# Patient Record
Sex: Female | Born: 1980 | Race: White | Hispanic: No | Marital: Married | Smoking: Current every day smoker
Health system: Southern US, Community
[De-identification: ages and names within clinical notes are randomized; demographics above are authoritative.]

## PROBLEM LIST (undated history)

## (undated) ENCOUNTER — Ambulatory Visit: Admission: EM | Source: Home / Self Care

## (undated) HISTORY — PX: ANKLE SURGERY: SHX546

## (undated) HISTORY — PX: CHOLECYSTECTOMY: SHX55

---

## 2012-04-20 ENCOUNTER — Ambulatory Visit: Payer: Self-pay | Admitting: Orthopaedic Surgery

## 2012-04-20 LAB — HCG, QUANTITATIVE, PREGNANCY: Beta Hcg, Quant.: <1 m[IU]/mL — ABNORMAL LOW

## 2014-11-10 ENCOUNTER — Other Ambulatory Visit: Payer: Self-pay | Admitting: Orthopedic Surgery

## 2014-11-10 DIAGNOSIS — M25511 Pain in right shoulder: Secondary | ICD-10-CM

## 2014-11-10 DIAGNOSIS — M75101 Unspecified rotator cuff tear or rupture of right shoulder, not specified as traumatic: Secondary | ICD-10-CM

## 2014-11-18 ENCOUNTER — Ambulatory Visit
Admission: RE | Admit: 2014-11-18 | Discharge: 2014-11-18 | Disposition: A | Discharge status: Home / Self Care | Payer: No Typology Code available for payment source | Source: Ambulatory Visit | Attending: Orthopedic Surgery | Admitting: Orthopedic Surgery

## 2014-11-18 DIAGNOSIS — M25511 Pain in right shoulder: Secondary | ICD-10-CM

## 2014-11-18 DIAGNOSIS — M7551 Bursitis of right shoulder: Secondary | ICD-10-CM | POA: Diagnosis not present

## 2014-11-18 DIAGNOSIS — M75101 Unspecified rotator cuff tear or rupture of right shoulder, not specified as traumatic: Secondary | ICD-10-CM

## 2016-05-30 DIAGNOSIS — M25571 Pain in right ankle and joints of right foot: Secondary | ICD-10-CM | POA: Diagnosis not present

## 2016-05-30 DIAGNOSIS — G894 Chronic pain syndrome: Secondary | ICD-10-CM | POA: Diagnosis not present

## 2016-05-30 DIAGNOSIS — M19079 Primary osteoarthritis, unspecified ankle and foot: Secondary | ICD-10-CM | POA: Diagnosis not present

## 2016-07-04 DIAGNOSIS — Z01419 Encounter for gynecological examination (general) (routine) without abnormal findings: Secondary | ICD-10-CM | POA: Diagnosis not present

## 2016-08-29 DIAGNOSIS — Z5181 Encounter for therapeutic drug level monitoring: Secondary | ICD-10-CM | POA: Diagnosis not present

## 2016-08-29 DIAGNOSIS — M25571 Pain in right ankle and joints of right foot: Secondary | ICD-10-CM | POA: Diagnosis not present

## 2016-08-29 DIAGNOSIS — G894 Chronic pain syndrome: Secondary | ICD-10-CM | POA: Diagnosis not present

## 2016-08-29 DIAGNOSIS — S9701XS Crushing injury of right ankle, sequela: Secondary | ICD-10-CM | POA: Diagnosis not present

## 2016-08-29 DIAGNOSIS — M19079 Primary osteoarthritis, unspecified ankle and foot: Secondary | ICD-10-CM | POA: Diagnosis not present

## 2016-12-02 DIAGNOSIS — M25571 Pain in right ankle and joints of right foot: Secondary | ICD-10-CM | POA: Diagnosis not present

## 2016-12-02 DIAGNOSIS — T402X5D Adverse effect of other opioids, subsequent encounter: Secondary | ICD-10-CM | POA: Diagnosis not present

## 2016-12-02 DIAGNOSIS — G8929 Other chronic pain: Secondary | ICD-10-CM | POA: Diagnosis not present

## 2016-12-02 DIAGNOSIS — Z5181 Encounter for therapeutic drug level monitoring: Secondary | ICD-10-CM | POA: Diagnosis not present

## 2017-04-03 DIAGNOSIS — Z5181 Encounter for therapeutic drug level monitoring: Secondary | ICD-10-CM | POA: Diagnosis not present

## 2017-04-03 DIAGNOSIS — M25571 Pain in right ankle and joints of right foot: Secondary | ICD-10-CM | POA: Diagnosis not present

## 2017-04-03 DIAGNOSIS — G43909 Migraine, unspecified, not intractable, without status migrainosus: Secondary | ICD-10-CM | POA: Diagnosis not present

## 2017-04-03 DIAGNOSIS — G8929 Other chronic pain: Secondary | ICD-10-CM | POA: Diagnosis not present

## 2017-05-01 DIAGNOSIS — R102 Pelvic and perineal pain: Secondary | ICD-10-CM | POA: Diagnosis not present

## 2017-05-02 ENCOUNTER — Other Ambulatory Visit: Payer: Self-pay | Admitting: Internal Medicine

## 2017-05-02 DIAGNOSIS — R102 Pelvic and perineal pain: Secondary | ICD-10-CM

## 2017-06-02 DIAGNOSIS — M19079 Primary osteoarthritis, unspecified ankle and foot: Secondary | ICD-10-CM | POA: Diagnosis not present

## 2017-06-02 DIAGNOSIS — M25571 Pain in right ankle and joints of right foot: Secondary | ICD-10-CM | POA: Diagnosis not present

## 2017-06-02 DIAGNOSIS — S9701XS Crushing injury of right ankle, sequela: Secondary | ICD-10-CM | POA: Diagnosis not present

## 2017-06-02 DIAGNOSIS — Z5181 Encounter for therapeutic drug level monitoring: Secondary | ICD-10-CM | POA: Diagnosis not present

## 2017-07-08 DIAGNOSIS — Z01419 Encounter for gynecological examination (general) (routine) without abnormal findings: Secondary | ICD-10-CM | POA: Diagnosis not present

## 2017-08-01 DIAGNOSIS — Z3200 Encounter for pregnancy test, result unknown: Secondary | ICD-10-CM | POA: Diagnosis not present

## 2017-09-08 DIAGNOSIS — Z5181 Encounter for therapeutic drug level monitoring: Secondary | ICD-10-CM | POA: Diagnosis not present

## 2017-09-08 DIAGNOSIS — M25571 Pain in right ankle and joints of right foot: Secondary | ICD-10-CM | POA: Diagnosis not present

## 2017-09-08 DIAGNOSIS — S9701XS Crushing injury of right ankle, sequela: Secondary | ICD-10-CM | POA: Diagnosis not present

## 2017-09-08 DIAGNOSIS — G894 Chronic pain syndrome: Secondary | ICD-10-CM | POA: Diagnosis not present

## 2017-09-08 DIAGNOSIS — M19079 Primary osteoarthritis, unspecified ankle and foot: Secondary | ICD-10-CM | POA: Diagnosis not present

## 2017-10-06 DIAGNOSIS — R5382 Chronic fatigue, unspecified: Secondary | ICD-10-CM | POA: Diagnosis not present

## 2017-10-06 DIAGNOSIS — M7041 Prepatellar bursitis, right knee: Secondary | ICD-10-CM | POA: Diagnosis not present

## 2017-10-30 DIAGNOSIS — M25561 Pain in right knee: Secondary | ICD-10-CM | POA: Diagnosis not present

## 2017-10-30 DIAGNOSIS — D862 Sarcoidosis of lung with sarcoidosis of lymph nodes: Secondary | ICD-10-CM | POA: Diagnosis not present

## 2017-10-30 DIAGNOSIS — S9701XS Crushing injury of right ankle, sequela: Secondary | ICD-10-CM | POA: Diagnosis not present

## 2017-10-30 DIAGNOSIS — R5382 Chronic fatigue, unspecified: Secondary | ICD-10-CM | POA: Diagnosis not present

## 2017-10-30 DIAGNOSIS — G8929 Other chronic pain: Secondary | ICD-10-CM | POA: Diagnosis not present

## 2017-10-30 DIAGNOSIS — M25562 Pain in left knee: Secondary | ICD-10-CM | POA: Diagnosis not present

## 2017-10-30 DIAGNOSIS — F17200 Nicotine dependence, unspecified, uncomplicated: Secondary | ICD-10-CM | POA: Diagnosis not present

## 2017-11-13 DIAGNOSIS — G894 Chronic pain syndrome: Secondary | ICD-10-CM | POA: Diagnosis not present

## 2017-11-13 DIAGNOSIS — R5382 Chronic fatigue, unspecified: Secondary | ICD-10-CM | POA: Diagnosis not present

## 2017-11-13 DIAGNOSIS — F17200 Nicotine dependence, unspecified, uncomplicated: Secondary | ICD-10-CM | POA: Diagnosis not present

## 2017-12-09 DIAGNOSIS — G894 Chronic pain syndrome: Secondary | ICD-10-CM | POA: Diagnosis not present

## 2017-12-09 DIAGNOSIS — M25571 Pain in right ankle and joints of right foot: Secondary | ICD-10-CM | POA: Diagnosis not present

## 2017-12-09 DIAGNOSIS — M19079 Primary osteoarthritis, unspecified ankle and foot: Secondary | ICD-10-CM | POA: Diagnosis not present

## 2017-12-09 DIAGNOSIS — G8929 Other chronic pain: Secondary | ICD-10-CM | POA: Diagnosis not present

## 2017-12-09 DIAGNOSIS — Z5181 Encounter for therapeutic drug level monitoring: Secondary | ICD-10-CM | POA: Diagnosis not present

## 2018-01-05 DIAGNOSIS — E559 Vitamin D deficiency, unspecified: Secondary | ICD-10-CM | POA: Diagnosis not present

## 2018-01-05 DIAGNOSIS — R000 Tachycardia, unspecified: Secondary | ICD-10-CM | POA: Diagnosis not present

## 2018-02-11 DIAGNOSIS — M19079 Primary osteoarthritis, unspecified ankle and foot: Secondary | ICD-10-CM | POA: Diagnosis not present

## 2018-02-11 DIAGNOSIS — G8929 Other chronic pain: Secondary | ICD-10-CM | POA: Diagnosis not present

## 2018-02-11 DIAGNOSIS — M25571 Pain in right ankle and joints of right foot: Secondary | ICD-10-CM | POA: Diagnosis not present

## 2018-02-11 DIAGNOSIS — Z5181 Encounter for therapeutic drug level monitoring: Secondary | ICD-10-CM | POA: Diagnosis not present

## 2018-04-13 DIAGNOSIS — S9701XS Crushing injury of right ankle, sequela: Secondary | ICD-10-CM | POA: Diagnosis not present

## 2018-04-13 DIAGNOSIS — M25571 Pain in right ankle and joints of right foot: Secondary | ICD-10-CM | POA: Diagnosis not present

## 2018-04-13 DIAGNOSIS — G894 Chronic pain syndrome: Secondary | ICD-10-CM | POA: Diagnosis not present

## 2018-04-13 DIAGNOSIS — M19079 Primary osteoarthritis, unspecified ankle and foot: Secondary | ICD-10-CM | POA: Diagnosis not present

## 2018-06-10 DIAGNOSIS — H9201 Otalgia, right ear: Secondary | ICD-10-CM | POA: Diagnosis not present

## 2018-06-30 DIAGNOSIS — R000 Tachycardia, unspecified: Secondary | ICD-10-CM | POA: Diagnosis not present

## 2018-07-07 DIAGNOSIS — E559 Vitamin D deficiency, unspecified: Secondary | ICD-10-CM | POA: Diagnosis not present

## 2018-07-07 DIAGNOSIS — R5382 Chronic fatigue, unspecified: Secondary | ICD-10-CM | POA: Diagnosis not present

## 2018-07-13 DIAGNOSIS — G894 Chronic pain syndrome: Secondary | ICD-10-CM | POA: Diagnosis not present

## 2018-07-13 DIAGNOSIS — M25571 Pain in right ankle and joints of right foot: Secondary | ICD-10-CM | POA: Diagnosis not present

## 2018-07-13 DIAGNOSIS — M19079 Primary osteoarthritis, unspecified ankle and foot: Secondary | ICD-10-CM | POA: Diagnosis not present

## 2018-07-14 DIAGNOSIS — Z01419 Encounter for gynecological examination (general) (routine) without abnormal findings: Secondary | ICD-10-CM | POA: Diagnosis not present

## 2020-02-17 ENCOUNTER — Other Ambulatory Visit (HOSPITAL_COMMUNITY): Payer: Self-pay | Admitting: Internal Medicine

## 2020-02-17 ENCOUNTER — Other Ambulatory Visit: Payer: Self-pay | Admitting: Internal Medicine

## 2020-02-17 DIAGNOSIS — G44221 Chronic tension-type headache, intractable: Secondary | ICD-10-CM

## 2020-02-26 ENCOUNTER — Ambulatory Visit: Payer: 59

## 2020-03-02 ENCOUNTER — Ambulatory Visit: Payer: 59

## 2020-03-08 ENCOUNTER — Ambulatory Visit: Payer: 59

## 2020-03-14 ENCOUNTER — Ambulatory Visit: Payer: 59

## 2020-04-04 ENCOUNTER — Ambulatory Visit: Payer: 59

## 2021-01-03 ENCOUNTER — Other Ambulatory Visit: Payer: Self-pay | Admitting: Internal Medicine

## 2021-01-03 DIAGNOSIS — G44221 Chronic tension-type headache, intractable: Secondary | ICD-10-CM

## 2021-01-10 ENCOUNTER — Other Ambulatory Visit: Payer: Self-pay

## 2021-01-10 ENCOUNTER — Ambulatory Visit
Admission: RE | Admit: 2021-01-10 | Discharge: 2021-01-10 | Disposition: A | Discharge status: Home / Self Care | Payer: 59 | Source: Ambulatory Visit | Attending: Internal Medicine | Admitting: Internal Medicine

## 2021-01-10 DIAGNOSIS — G44221 Chronic tension-type headache, intractable: Secondary | ICD-10-CM | POA: Diagnosis present

## 2021-01-23 ENCOUNTER — Other Ambulatory Visit: Payer: Self-pay

## 2021-01-23 ENCOUNTER — Emergency Department: Payer: 59 | Admitting: Anesthesiology

## 2021-01-23 ENCOUNTER — Emergency Department: Payer: 59

## 2021-01-23 ENCOUNTER — Encounter
Admission: EM | Disposition: A | Discharge status: Home / Self Care | Payer: Self-pay | Source: Home / Self Care | Attending: Emergency Medicine

## 2021-01-23 ENCOUNTER — Ambulatory Visit
Admission: EM | Admit: 2021-01-23 | Discharge: 2021-01-23 | Disposition: A | Discharge status: Home / Self Care | Payer: 59 | Attending: Surgery | Admitting: Surgery

## 2021-01-23 DIAGNOSIS — K8012 Calculus of gallbladder with acute and chronic cholecystitis without obstruction: Secondary | ICD-10-CM | POA: Diagnosis not present

## 2021-01-23 DIAGNOSIS — K810 Acute cholecystitis: Secondary | ICD-10-CM

## 2021-01-23 DIAGNOSIS — Z20822 Contact with and (suspected) exposure to covid-19: Secondary | ICD-10-CM | POA: Diagnosis not present

## 2021-01-23 DIAGNOSIS — K8020 Calculus of gallbladder without cholecystitis without obstruction: Secondary | ICD-10-CM

## 2021-01-23 DIAGNOSIS — R1011 Right upper quadrant pain: Secondary | ICD-10-CM

## 2021-01-23 LAB — URINALYSIS, ROUTINE W REFLEX MICROSCOPIC
Bacteria, UA: NONE SEEN
Bilirubin Urine: NEGATIVE
Glucose, UA: NEGATIVE mg/dL
Hgb urine dipstick: NEGATIVE
Ketones, ur: NEGATIVE mg/dL
Nitrite: NEGATIVE
Protein, ur: NEGATIVE mg/dL
Specific Gravity, Urine: 1.016 (ref 1.005–1.030)
pH: 6 (ref 5.0–8.0)

## 2021-01-23 LAB — BASIC METABOLIC PANEL
Anion gap: 7 (ref 5–15)
BUN: 10 mg/dL (ref 6–20)
CO2: 30 mmol/L (ref 22–32)
Calcium: 9.1 mg/dL (ref 8.9–10.3)
Chloride: 101 mmol/L (ref 98–111)
Creatinine, Ser: 0.75 mg/dL (ref 0.44–1.00)
GFR, Estimated: >60 mL/min (ref >60)
Glucose, Bld: 132 mg/dL — ABNORMAL HIGH (ref 70–99)
Potassium: 3.9 mmol/L (ref 3.5–5.1)
Sodium: 138 mmol/L (ref 135–145)

## 2021-01-23 LAB — HEPATIC FUNCTION PANEL
ALT: 36 U/L (ref 0–44)
AST: 32 U/L (ref 15–41)
Albumin: 3.7 g/dL (ref 3.5–5.0)
Alkaline Phosphatase: 87 U/L (ref 38–126)
Bilirubin, Direct: <0.1 mg/dL (ref 0.0–0.2)
Total Bilirubin: 0.5 mg/dL (ref 0.3–1.2)
Total Protein: 7.2 g/dL (ref 6.5–8.1)

## 2021-01-23 LAB — RESP PANEL BY RT-PCR (FLU A&B, COVID) ARPGX2
Influenza A by PCR: NEGATIVE
Influenza B by PCR: NEGATIVE
SARS Coronavirus 2 by RT PCR: NEGATIVE

## 2021-01-23 LAB — CBC
HCT: 40.6 % (ref 36.0–46.0)
Hemoglobin: 13.8 g/dL (ref 12.0–15.0)
MCH: 29.7 pg (ref 26.0–34.0)
MCHC: 34 g/dL (ref 30.0–36.0)
MCV: 87.5 fL (ref 80.0–100.0)
Platelets: 231 10*3/uL (ref 150–400)
RBC: 4.64 MIL/uL (ref 3.87–5.11)
RDW: 12.4 % (ref 11.5–15.5)
WBC: 7.3 10*3/uL (ref 4.0–10.5)
nRBC: 0 % (ref 0.0–0.2)

## 2021-01-23 LAB — POC URINE PREG, ED: Preg Test, Ur: NEGATIVE

## 2021-01-23 LAB — LIPASE, BLOOD: Lipase: 38 U/L (ref 11–51)

## 2021-01-23 SURGERY — CHOLECYSTECTOMY, ROBOT-ASSISTED, LAPAROSCOPIC
Anesthesia: General | Site: Abdomen

## 2021-01-23 MED ORDER — SUGAMMADEX SODIUM 200 MG/2ML IV SOLN
INTRAVENOUS | Status: DC | PRN
  Administered 2021-01-23: 300 mg via INTRAVENOUS

## 2021-01-23 MED ORDER — ONDANSETRON HCL 4 MG/2ML IJ SOLN
4.0000 mg | Freq: Once | INTRAMUSCULAR | Status: AC
  Administered 2021-01-23: 4 mg via INTRAVENOUS
  Filled 2021-01-23: qty 2

## 2021-01-23 MED ORDER — KETAMINE HCL 10 MG/ML IJ SOLN
INTRAMUSCULAR | Status: DC | PRN
  Administered 2021-01-23: 50 mg via INTRAVENOUS

## 2021-01-23 MED ORDER — ONDANSETRON HCL 4 MG/2ML IJ SOLN
INTRAMUSCULAR | Status: DC | PRN
  Administered 2021-01-23: 4 mg via INTRAVENOUS

## 2021-01-23 MED ORDER — DEXMEDETOMIDINE (PRECEDEX) IN NS 20 MCG/5ML (4 MCG/ML) IV SYRINGE
PREFILLED_SYRINGE | INTRAVENOUS | Status: DC | PRN
  Administered 2021-01-23: 8 ug via INTRAVENOUS
  Administered 2021-01-23 (×2): 12 ug via INTRAVENOUS

## 2021-01-23 MED ORDER — HYDROMORPHONE HCL 1 MG/ML IJ SOLN
INTRAMUSCULAR | Status: DC | PRN
  Administered 2021-01-23: 1 mg via INTRAVENOUS

## 2021-01-23 MED ORDER — FENTANYL CITRATE (PF) 100 MCG/2ML IJ SOLN
INTRAMUSCULAR | Status: DC | PRN
  Administered 2021-01-23: 100 ug via INTRAVENOUS

## 2021-01-23 MED ORDER — LIDOCAINE HCL (PF) 2 % IJ SOLN
INTRAMUSCULAR | Status: AC
  Filled 2021-01-23: qty 5

## 2021-01-23 MED ORDER — IOHEXOL 300 MG/ML  SOLN
100.0000 mL | Freq: Once | INTRAMUSCULAR | Status: AC | PRN
  Administered 2021-01-23: 100 mL via INTRAVENOUS
  Filled 2021-01-23: qty 100

## 2021-01-23 MED ORDER — ROCURONIUM BROMIDE 10 MG/ML (PF) SYRINGE
PREFILLED_SYRINGE | INTRAVENOUS | Status: AC
  Filled 2021-01-23: qty 10

## 2021-01-23 MED ORDER — HYDROMORPHONE HCL 1 MG/ML IJ SOLN
INTRAMUSCULAR | Status: AC
  Filled 2021-01-23: qty 1

## 2021-01-23 MED ORDER — ALBUTEROL SULFATE HFA 108 (90 BASE) MCG/ACT IN AERS
INHALATION_SPRAY | RESPIRATORY_TRACT | Status: DC | PRN
  Administered 2021-01-23 (×2): 4 via RESPIRATORY_TRACT

## 2021-01-23 MED ORDER — OXYCODONE-ACETAMINOPHEN 5-325 MG PO TABS
1.0000 | ORAL_TABLET | Freq: Once | ORAL | Status: AC
  Administered 2021-01-23: 1 via ORAL
  Filled 2021-01-23: qty 1

## 2021-01-23 MED ORDER — FENTANYL CITRATE (PF) 100 MCG/2ML IJ SOLN
INTRAMUSCULAR | Status: AC
  Filled 2021-01-23: qty 2

## 2021-01-23 MED ORDER — FENTANYL CITRATE (PF) 100 MCG/2ML IJ SOLN
25.0000 ug | INTRAMUSCULAR | Status: DC | PRN
  Administered 2021-01-23 (×6): 25 ug via INTRAVENOUS

## 2021-01-23 MED ORDER — MORPHINE SULFATE (PF) 4 MG/ML IV SOLN
4.0000 mg | Freq: Once | INTRAVENOUS | Status: AC
  Administered 2021-01-23: 4 mg via INTRAVENOUS
  Filled 2021-01-23: qty 1

## 2021-01-23 MED ORDER — PROPOFOL 10 MG/ML IV BOLUS
INTRAVENOUS | Status: AC
  Filled 2021-01-23: qty 20

## 2021-01-23 MED ORDER — BUPIVACAINE-EPINEPHRINE 0.25% -1:200000 IJ SOLN
INTRAMUSCULAR | Status: DC | PRN
  Administered 2021-01-23: 50 mL via SURGICAL_CAVITY

## 2021-01-23 MED ORDER — OXYCODONE HCL 5 MG/5ML PO SOLN: 5.0000 mg | Freq: Once | ORAL | Status: AC | PRN

## 2021-01-23 MED ORDER — INDOCYANINE GREEN 25 MG IV SOLR
5.0000 mg | Freq: Once | INTRAVENOUS | Status: AC
  Administered 2021-01-23: 5 mg via INTRAVENOUS
  Filled 2021-01-23: qty 10

## 2021-01-23 MED ORDER — CHLORHEXIDINE GLUCONATE CLOTH 2 % EX PADS
6.0000 | MEDICATED_PAD | Freq: Every day | CUTANEOUS | Status: DC
  Filled 2021-01-23: qty 6

## 2021-01-23 MED ORDER — DEXAMETHASONE SODIUM PHOSPHATE 10 MG/ML IJ SOLN
INTRAMUSCULAR | Status: DC | PRN
  Administered 2021-01-23: 10 mg via INTRAVENOUS

## 2021-01-23 MED ORDER — OXYCODONE-ACETAMINOPHEN 5-325 MG PO TABS: 1.0000 | ORAL_TABLET | ORAL | 0 refills | Status: DC | PRN

## 2021-01-23 MED ORDER — ALBUTEROL SULFATE HFA 108 (90 BASE) MCG/ACT IN AERS
INHALATION_SPRAY | RESPIRATORY_TRACT | Status: AC
  Filled 2021-01-23: qty 6.7

## 2021-01-23 MED ORDER — PROPOFOL 10 MG/ML IV BOLUS
INTRAVENOUS | Status: DC | PRN
  Administered 2021-01-23: 200 mg via INTRAVENOUS

## 2021-01-23 MED ORDER — PHENYLEPHRINE HCL (PRESSORS) 10 MG/ML IV SOLN
INTRAVENOUS | Status: DC | PRN
  Administered 2021-01-23: 160 ug via INTRAVENOUS
  Administered 2021-01-23 (×2): 80 ug via INTRAVENOUS

## 2021-01-23 MED ORDER — DEXMEDETOMIDINE (PRECEDEX) IN NS 20 MCG/5ML (4 MCG/ML) IV SYRINGE
PREFILLED_SYRINGE | INTRAVENOUS | Status: AC
  Filled 2021-01-23: qty 5

## 2021-01-23 MED ORDER — OXYCODONE HCL 5 MG PO TABS
ORAL_TABLET | ORAL | Status: AC
  Filled 2021-01-23: qty 1

## 2021-01-23 MED ORDER — KETAMINE HCL 50 MG/5ML IJ SOSY
PREFILLED_SYRINGE | INTRAMUSCULAR | Status: AC
  Filled 2021-01-23: qty 5

## 2021-01-23 MED ORDER — BUPIVACAINE LIPOSOME 1.3 % IJ SUSP
INTRAMUSCULAR | Status: AC
  Filled 2021-01-23: qty 20

## 2021-01-23 MED ORDER — KETOROLAC TROMETHAMINE 30 MG/ML IJ SOLN
INTRAMUSCULAR | Status: DC | PRN
  Administered 2021-01-23: 15 mg via INTRAVENOUS

## 2021-01-23 MED ORDER — OXYCODONE HCL 5 MG PO TABS
5.0000 mg | ORAL_TABLET | Freq: Once | ORAL | Status: AC | PRN
  Administered 2021-01-23: 5 mg via ORAL

## 2021-01-23 MED ORDER — 0.9 % SODIUM CHLORIDE (POUR BTL) OPTIME
TOPICAL | Status: DC | PRN
  Administered 2021-01-23: 200 mL

## 2021-01-23 MED ORDER — BUPIVACAINE-EPINEPHRINE (PF) 0.25% -1:200000 IJ SOLN
INTRAMUSCULAR | Status: AC
  Filled 2021-01-23: qty 30

## 2021-01-23 MED ORDER — LACTATED RINGERS IV SOLN: INTRAVENOUS | Status: DC | PRN

## 2021-01-23 MED ORDER — MIDAZOLAM HCL 2 MG/2ML IJ SOLN
INTRAMUSCULAR | Status: AC
  Filled 2021-01-23: qty 2

## 2021-01-23 MED ORDER — KETOROLAC TROMETHAMINE 30 MG/ML IJ SOLN
INTRAMUSCULAR | Status: AC
  Filled 2021-01-23: qty 1

## 2021-01-23 MED ORDER — LIDOCAINE HCL (CARDIAC) PF 100 MG/5ML IV SOSY
PREFILLED_SYRINGE | INTRAVENOUS | Status: DC | PRN
  Administered 2021-01-23: 100 mg via INTRAVENOUS

## 2021-01-23 MED ORDER — ONDANSETRON HCL 4 MG/2ML IJ SOLN: 4.0000 mg | Freq: Once | INTRAMUSCULAR | Status: DC | PRN

## 2021-01-23 MED ORDER — PROMETHAZINE HCL 25 MG/ML IJ SOLN: 6.2500 mg | INTRAMUSCULAR | Status: DC | PRN

## 2021-01-23 MED ORDER — SODIUM CHLORIDE 0.9 % IV SOLN
2.0000 g | INTRAVENOUS | Status: DC
  Administered 2021-01-23: 2 g via INTRAVENOUS
  Filled 2021-01-23: qty 20

## 2021-01-23 MED ORDER — SUCCINYLCHOLINE CHLORIDE 200 MG/10ML IV SOSY
PREFILLED_SYRINGE | INTRAVENOUS | Status: DC | PRN
  Administered 2021-01-23: 100 mg via INTRAVENOUS

## 2021-01-23 MED ORDER — SODIUM CHLORIDE 0.9 % IV BOLUS
1000.0000 mL | Freq: Once | INTRAVENOUS | Status: AC
  Administered 2021-01-23: 1000 mL via INTRAVENOUS

## 2021-01-23 MED ORDER — METOCLOPRAMIDE HCL 5 MG/ML IJ SOLN
10.0000 mg | Freq: Once | INTRAMUSCULAR | Status: AC
  Administered 2021-01-23: 10 mg via INTRAVENOUS
  Filled 2021-01-23: qty 2

## 2021-01-23 MED ORDER — ROCURONIUM BROMIDE 100 MG/10ML IV SOLN
INTRAVENOUS | Status: DC | PRN
  Administered 2021-01-23: 20 mg via INTRAVENOUS
  Administered 2021-01-23: 50 mg via INTRAVENOUS
  Administered 2021-01-23: 20 mg via INTRAVENOUS
  Administered 2021-01-23: 10 mg via INTRAVENOUS

## 2021-01-23 MED ORDER — SUCCINYLCHOLINE CHLORIDE 200 MG/10ML IV SOSY
PREFILLED_SYRINGE | INTRAVENOUS | Status: AC
  Filled 2021-01-23: qty 10

## 2021-01-23 MED ORDER — MIDAZOLAM HCL 2 MG/2ML IJ SOLN
INTRAMUSCULAR | Status: DC | PRN
  Administered 2021-01-23: 2 mg via INTRAVENOUS

## 2021-01-23 SURGICAL SUPPLY — 52 items
CANNULA REDUC XI 12-8 STAPL (CANNULA) ×1
CANNULA REDUC XI 12-8MM STAPL (CANNULA) ×1
CANNULA REDUCER 12-8 DVNC XI (CANNULA) ×1 IMPLANT
CHLORAPREP W/TINT 26 (MISCELLANEOUS) ×3 IMPLANT
CLIP LIGATING HEM O LOK PURPLE (MISCELLANEOUS) ×3 IMPLANT
CLIP LIGATING HEMO O LOK GREEN (MISCELLANEOUS) ×3 IMPLANT
DECANTER SPIKE VIAL GLASS SM (MISCELLANEOUS) ×3 IMPLANT
DEFOGGER SCOPE WARMER CLEARIFY (MISCELLANEOUS) ×3 IMPLANT
DERMABOND ADVANCED (GAUZE/BANDAGES/DRESSINGS) ×2
DERMABOND ADVANCED .7 DNX12 (GAUZE/BANDAGES/DRESSINGS) ×1 IMPLANT
DRAPE ARM DVNC X/XI (DISPOSABLE) ×4 IMPLANT
DRAPE COLUMN DVNC XI (DISPOSABLE) ×1 IMPLANT
DRAPE DA VINCI XI ARM (DISPOSABLE) ×8
DRAPE DA VINCI XI COLUMN (DISPOSABLE) ×2
ELECT CAUTERY BLADE 6.4 (BLADE) ×3 IMPLANT
ELECT REM PT RETURN 9FT ADLT (ELECTROSURGICAL) ×3
ELECTRODE REM PT RTRN 9FT ADLT (ELECTROSURGICAL) ×1 IMPLANT
GAUZE 4X4 16PLY ~~LOC~~+RFID DBL (SPONGE) ×3 IMPLANT
GLOVE SURG ENC MOIS LTX SZ7 (GLOVE) ×6 IMPLANT
GOWN STRL REUS W/ TWL LRG LVL3 (GOWN DISPOSABLE) ×4 IMPLANT
GOWN STRL REUS W/TWL LRG LVL3 (GOWN DISPOSABLE) ×8
IRRIGATION STRYKERFLOW (MISCELLANEOUS) IMPLANT
IRRIGATOR STRYKERFLOW (MISCELLANEOUS)
IV NS 1000ML (IV SOLUTION) ×2
IV NS 1000ML BAXH (IV SOLUTION) ×1 IMPLANT
KIT PINK PAD W/HEAD ARE REST (MISCELLANEOUS) ×3 IMPLANT
KIT PINK PAD W/HEAD ARM REST (MISCELLANEOUS) ×1 IMPLANT
LABEL OR SOLS (LABEL) ×3 IMPLANT
MANIFOLD NEPTUNE II (INSTRUMENTS) ×3 IMPLANT
NEEDLE HYPO 22GX1.5 SAFETY (NEEDLE) ×3 IMPLANT
NS IRRIG 500ML POUR BTL (IV SOLUTION) ×3 IMPLANT
OBTURATOR OPTICAL STANDARD 8MM (TROCAR) ×2
OBTURATOR OPTICAL STND 8 DVNC (TROCAR) ×1
OBTURATOR OPTICALSTD 8 DVNC (TROCAR) ×1 IMPLANT
PACK LAP CHOLECYSTECTOMY (MISCELLANEOUS) ×3 IMPLANT
PENCIL ELECTRO HAND CTR (MISCELLANEOUS) ×3 IMPLANT
POUCH SPECIMEN RETRIEVAL 10MM (ENDOMECHANICALS) ×3 IMPLANT
SEAL CANN UNIV 5-8 DVNC XI (MISCELLANEOUS) ×3 IMPLANT
SEAL XI 5MM-8MM UNIVERSAL (MISCELLANEOUS) ×6
SET TUBE SMOKE EVAC HIGH FLOW (TUBING) ×3 IMPLANT
SOLUTION ELECTROLUBE (MISCELLANEOUS) ×3 IMPLANT
SPONGE T-LAP 18X18 ~~LOC~~+RFID (SPONGE) ×3 IMPLANT
SPONGE T-LAP 4X18 ~~LOC~~+RFID (SPONGE) IMPLANT
STAPLER CANNULA SEAL DVNC XI (STAPLE) ×1 IMPLANT
STAPLER CANNULA SEAL XI (STAPLE) ×2
SUT MNCRL AB 4-0 PS2 18 (SUTURE) ×3 IMPLANT
SUT VICRYL 0 AB UR-6 (SUTURE) ×6 IMPLANT
SYR 20ML LL LF (SYRINGE) ×3 IMPLANT
SYR 30ML LL (SYRINGE) ×3 IMPLANT
TAPE TRANSPORE STRL 2 31045 (GAUZE/BANDAGES/DRESSINGS) ×3 IMPLANT
TROCAR BALLN GELPORT 12X130M (ENDOMECHANICALS) ×3 IMPLANT
WATER STERILE IRR 500ML POUR (IV SOLUTION) ×3 IMPLANT
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
## 2021-01-23 NOTE — Anesthesia Preprocedure Evaluation (Signed)
Anesthesia Evaluation  Patient identified by MRN, date of birth, ID band Patient awake    Reviewed: Allergy & Precautions, H&P , NPO status , Patient's Chart, lab work & pertinent test results, reviewed documented beta blocker date and time   Airway Mallampati: II  TM Distance: >3 FB Neck ROM: full    Dental  (+) Teeth Intact   Pulmonary neg pulmonary ROS, Current Smoker,    Pulmonary exam normal        Cardiovascular Exercise Tolerance: Good negative cardio ROS Normal cardiovascular exam Rhythm:regular Rate:Normal     Neuro/Psych negative neurological ROS  negative psych ROS   GI/Hepatic negative GI ROS, Neg liver ROS,   Endo/Other  Morbid obesity  Renal/GU negative Renal ROS  negative genitourinary   Musculoskeletal   Abdominal   Peds  Hematology negative hematology ROS (+)   Anesthesia Other Findings History reviewed. No pertinent past medical history. History reviewed. No pertinent surgical history. BMI    Body Mass Index: 37.59 kg/m     Reproductive/Obstetrics negative OB ROS                             Anesthesia Physical Anesthesia Plan  ASA: 3 and emergent  Anesthesia Plan: General ETT   Post-op Pain Management:    Induction:   PONV Risk Score and Plan: 3  Airway Management Planned:   Additional Equipment:   Intra-op Plan:   Post-operative Plan:   Informed Consent: I have reviewed the patients History and Physical, chart, labs and discussed the procedure including the risks, benefits and alternatives for the proposed anesthesia with the patient or authorized representative who has indicated his/her understanding and acceptance.     Dental Advisory Given  Plan Discussed with: CRNA  Anesthesia Plan Comments:         Anesthesia Quick Evaluation

## 2021-01-23 NOTE — ED Notes (Signed)
See triage note  presents with pain to right flank area  pian started this am   no fever

## 2021-01-23 NOTE — OR Nursing (Signed)
Patient stated that she had applied her own fentanyl patch on her left lower abdomen; Patch was removed and area was cleaned thoroughly before prepping.

## 2021-01-23 NOTE — Anesthesia Postprocedure Evaluation (Signed)
Anesthesia Post Note  Patient: Haley Hunt  Procedure(s) Performed: XI ROBOTIC ASSISTED LAPAROSCOPIC CHOLECYSTECTOMY (Abdomen) INDOCYANINE GREEN FLUORESCENCE IMAGING (ICG) (Abdomen)  Patient location during evaluation: PACU Anesthesia Type: General Level of consciousness: awake and alert Pain management: pain level controlled Vital Signs Assessment: post-procedure vital signs reviewed and stable Respiratory status: spontaneous breathing, nonlabored ventilation and respiratory function stable Cardiovascular status: blood pressure returned to baseline and stable Postop Assessment: no apparent nausea or vomiting Anesthetic complications: no   No notable events documented.   Last Vitals:  Vitals:   01/23/21 1800 01/23/21 1815  BP: 106/71   Pulse: 79   Resp: 16 16  Temp:  36.4 C  SpO2: 95%     Last Pain:  Vitals:   01/23/21 1815  TempSrc:   PainSc: 4                  Foye Deer

## 2021-01-23 NOTE — Anesthesia Procedure Notes (Signed)
Procedure Name: Intubation Date/Time: 01/23/2021 3:22 PM Performed by: Carter Kitten, CRNA Pre-anesthesia Checklist: Patient identified, Patient being monitored, Timeout performed, Emergency Drugs available and Suction available Patient Re-evaluated:Patient Re-evaluated prior to induction Oxygen Delivery Method: Circle system utilized Preoxygenation: Pre-oxygenation with 100% oxygen Induction Type: IV induction Ventilation: Mask ventilation without difficulty Laryngoscope Size: 3 and McGraph Grade View: Grade I Tube type: Oral Tube size: 7.0 mm Number of attempts: 1 Airway Equipment and Method: Stylet Placement Confirmation: ETT inserted through vocal cords under direct vision, positive ETCO2 and breath sounds checked- equal and bilateral Secured at: 22 cm Tube secured with: Tape Dental Injury: Teeth and Oropharynx as per pre-operative assessment

## 2021-01-23 NOTE — Transfer of Care (Signed)
Immediate Anesthesia Transfer of Care Note  Patient: DESHAWN SKELLEY  Procedure(s) Performed: XI ROBOTIC ASSISTED LAPAROSCOPIC CHOLECYSTECTOMY (Abdomen) INDOCYANINE GREEN FLUORESCENCE IMAGING (ICG) (Abdomen)  Patient Location: PACU  Anesthesia Type:General  Level of Consciousness: drowsy and patient cooperative  Airway & Oxygen Therapy: Patient Spontanous Breathing and Patient connected to face mask oxygen  Post-op Assessment: Report given to RN and Post -op Vital signs reviewed and stable  Post vital signs: Reviewed and stable  Last Vitals:  Vitals Value Taken Time  BP 106/67 01/23/21 1711  Temp 36.6 C 01/23/21 1711  Pulse 88 01/23/21 1715  Resp 17 01/23/21 1715  SpO2 100 % 01/23/21 1715  Vitals shown include unvalidated device data.  Last Pain:  Vitals:   01/23/21 1415  TempSrc: Temporal  PainSc: 6          Complications: No notable events documented.

## 2021-01-23 NOTE — Op Note (Signed)
Robotic assisted laparoscopic Cholecystectomy  Pre-operative Diagnosis: cholecystitis  Post-operative Diagnosis: same  Procedure:  Robotic assisted laparoscopic Cholecystectomy  Surgeon: Sterling Big, MD FACS  Anesthesia: Gen. with endotracheal tube  Findings: Acute severe Cholecystitis   Estimated Blood Loss: 5cc       Specimens: Gallbladder           Complications: none   Procedure Details  The patient was seen again in the Holding Room. The benefits, complications, treatment options, and expected outcomes were discussed with the patient. The risks of bleeding, infection, recurrence of symptoms, failure to resolve symptoms, bile duct damage, bile duct leak, retained common bile duct stone, bowel injury, any of which could require further surgery and/or ERCP, stent, or papillotomy were reviewed with the patient. The likelihood of improving the patient's symptoms with return to their baseline status is good.  The patient and/or family concurred with the proposed plan, giving informed consent.  The patient was taken to Operating Room, identified  and the procedure verified as Laparoscopic Cholecystectomy.  A Time Out was held and the above information confirmed.  Prior to the induction of general anesthesia, antibiotic prophylaxis was administered. VTE prophylaxis was in place. General endotracheal anesthesia was then administered and tolerated well. After the induction, the abdomen was prepped with Chloraprep and draped in the sterile fashion. The patient was positioned in the supine position.  Cut down technique was used to enter the abdominal cavity and a Hasson trochar was placed after two vicryl stitches were anchored to the fascia. Pneumoperitoneum was then created with CO2 and tolerated well without any adverse changes in the patient's vital signs.  Three 8-mm ports were placed under direct vision. All skin incisions  were infiltrated with a local anesthetic agent before making the  incision and placing the trocars.   The patient was positioned  in reverse Trendelenburg, robot was brought to the surgical field and docked in the standard fashion.  We made sure all the instrumentation was kept indirect view at all times and that there were no collision between the arms. I scrubbed out and went to the console.  The gallbladder was identified, the fundus grasped and retracted cephalad. Adhesions were lysed bluntly. The infundibulum was grasped and retracted laterally, exposing the peritoneum overlying the triangle of Calot. This was then divided and exposed in a blunt fashion. An extended critical view of the cystic duct and cystic artery was obtained.  The cystic duct was clearly identified and bluntly dissected.   Artery and duct were double clipped and divided. Using ICG cholangiography we visualize the cystic duct and CBD, no evidence of bile injuries observed. The gallbladder was taken from the gallbladder fossa in a retrograde fashion with the electrocautery.  Hemostasis was achieved with the electrocautery. nspection of the right upper quadrant was performed. No bleeding, bile duct injury or leak, or bowel injury was noted. Robotic instruments and robotic arms were undocked in the standard fashion.  I scrubbed back in.  The gallbladder was removed and placed in an Endocatch bag.   Pneumoperitoneum was released.  The periumbilical port site was closed with interrumpted 0 Vicryl sutures. 4-0 subcuticular Monocryl was used to close the skin. Dermabond was  applied.  The patient was then extubated and brought to the recovery room in stable condition. Sponge, lap, and needle counts were correct at closure and at the conclusion of the case.               Sterling Big, MD, FACS

## 2021-01-23 NOTE — Discharge Instructions (Addendum)
Laparoscopic Cholecystectomy, Care After  ° °These instructions give you information on caring for yourself after your procedure. Your doctor may also give you more specific instructions. Call your doctor if you have any problems or questions after your procedure.  °HOME CARE  °Change your bandages (dressings) as told by your doctor.  °Keep the wound dry and clean. Wash the wound gently with soap and water. Pat the wound dry with a clean towel.  °Do not take baths, swim, or use hot tubs for 2 weeks, or as told by your doctor.  °Only take medicine as told by your doctor.  °Eat a normal diet as told by your doctor.  °Do not lift anything heavier than 10 pounds (4.5 kg) until your doctor says it is okay.  °Do not play contact sports for 1 week, or as told by your doctor. °GET HELP IF:  °Your wound is red, puffy (swollen), or painful.  °You have yellowish-white fluid (pus) coming from the wound.  °You have fluid draining from the wound for more than 1 day.  °You have a bad smell coming from the wound.  °Your wound breaks open. °GET HELP RIGHT AWAY IF:  °You have trouble breathing.  °You have chest pain.  °You have a fever >101  °You have pain in the shoulders (shoulder strap areas) that is getting worse.  °You feel dizzy or pass out (faint).  °You have severe belly (abdominal) pain.  °You feel sick to your stomach (nauseous) or throw up (vomit) for more than 1 day. ° °AMBULATORY SURGERY  °DISCHARGE INSTRUCTIONS ° ° °The drugs that you were given will stay in your system until tomorrow so for the next 24 hours you should not: ° °Drive an automobile °Make any legal decisions °Drink any alcoholic beverage ° ° °You may resume regular meals tomorrow.  Today it is better to start with liquids and gradually work up to solid foods. ° °You may eat anything you prefer, but it is better to start with liquids, then soup and crackers, and gradually work up to solid foods. ° ° °Please notify your doctor immediately if you have any  unusual bleeding, trouble breathing, redness and pain at the surgery site, drainage, fever, or pain not relieved by medication. ° ° ° °Additional Instructions: °Please contact your physician with any problems or Same Day Surgery at [phone], Monday through Friday 6 am to 4 pm, or [place] [facility] [place] Main number at [phone].  °  °

## 2021-01-23 NOTE — ED Triage Notes (Signed)
Pt to ED for right flank pain that started this am. +nausea. Denies urinary sx.  Ambulatory.

## 2021-01-25 LAB — SURGICAL PATHOLOGY

## 2021-01-29 ENCOUNTER — Telehealth: Payer: Self-pay | Admitting: *Deleted

## 2021-01-29 NOTE — Telephone Encounter (Signed)
Patient called and stated that she would like to get a work note faxed over to her job. She was out of work all last week and returning to work today. She had her gallbladder removed on 01/23/21 by Dr.Pabon    Fax number [phone]

## 2021-02-07 ENCOUNTER — Ambulatory Visit (INDEPENDENT_AMBULATORY_CARE_PROVIDER_SITE_OTHER): Payer: 59 | Admitting: Surgery

## 2021-02-07 ENCOUNTER — Encounter: Payer: Self-pay | Admitting: Surgery

## 2021-02-07 ENCOUNTER — Other Ambulatory Visit: Payer: Self-pay

## 2021-02-07 VITALS — BP 121/85 | HR 90 | Temp 98.7°F | Ht 67.0 in | Wt 240.0 lb

## 2021-02-07 DIAGNOSIS — Z09 Encounter for follow-up examination after completed treatment for conditions other than malignant neoplasm: Secondary | ICD-10-CM

## 2021-02-07 NOTE — Patient Instructions (Signed)
GENERAL POST-OPERATIVE PATIENT INSTRUCTIONS   WOUND CARE INSTRUCTIONS:  Keep a dry clean dressing on the wound if there is drainage. The initial bandage may be removed after 24 hours.  Once the wound has quit draining you may leave it open to air.  If clothing rubs against the wound or causes irritation and the wound is not draining you may cover it with a dry dressing during the daytime.  Try to keep the wound dry and avoid ointments on the wound unless directed to do so.  If the wound becomes bright red and painful or starts to drain infected material that is not clear, please contact your physician immediately.  If the wound is mildly pink and has a thick firm ridge underneath it, this is normal, and is referred to as a healing ridge.  This will resolve over the next 4-6 weeks.  BATHING: You may shower if you have been informed of this by your surgeon. However, Please do not submerge in a tub, hot tub, or pool until incisions are completely sealed or have been told by your surgeon that you may do so.  DIET:  You may eat any foods that you can tolerate.  It is a good idea to eat a high fiber diet and take in plenty of fluids to prevent constipation.  If you do become constipated you may want to take a mild laxative or take ducolax tablets on a daily basis until your bowel habits are regular.  Constipation can be very uncomfortable, along with straining, after recent surgery.  ACTIVITY:  You are encouraged to cough and deep breath or use your incentive spirometer if you were given one, every 15-30 minutes when awake.  This will help prevent respiratory complications and low grade fevers post-operatively if you had a general anesthetic.  You may want to hug a pillow when coughing and sneezing to add additional support to the surgical area, if you had abdominal or chest surgery, which will decrease pain during these times.  You are encouraged to walk and engage in light activity for the next two weeks.  You  should not lift more than 20 pounds for 4-6 weeks after surgery as it could put you at increased risk for complications.  Twenty pounds is roughly equivalent to a plastic bag of groceries. At that time- Listen to your body when lifting, if you have pain when lifting, stop and then try again in a few days. Soreness after doing exercises or activities of daily living is normal as you get back in to your normal routine.  MEDICATIONS:  Try to take narcotic medications and anti-inflammatory medications, such as tylenol, ibuprofen, naprosyn, etc., with food.  This will minimize stomach upset from the medication.  Should you develop nausea and vomiting from the pain medication, or develop a rash, please discontinue the medication and contact your physician.  You should not drive, make important decisions, or operate machinery when taking narcotic pain medication.  SUNBLOCK Use sun block to incision area over the next year if this area will be exposed to sun. This helps decrease scarring and will allow you avoid a permanent darkened area over your incision.  QUESTIONS:  Please feel free to call our office if you have any questions, and we will be glad to assist you.    

## 2021-02-09 ENCOUNTER — Encounter: Payer: Self-pay | Admitting: Surgery

## 2021-02-09 NOTE — Progress Notes (Signed)
Haley Hunt is 2 weeks out following robotic cholecystectomy for acute cholecystitis.  Pathology discussed with the patient.  She is doing very well and has no complaints.  She is tolerating diet.  No fevers no chills  PE NAD Abd: soft, incisions c/d/I, no infection or peritonitis  A/p Doing well w/o complications RTC prn No heavy lifting

## 2021-11-09 ENCOUNTER — Ambulatory Visit: Payer: Self-pay | Admitting: Urology

## 2021-11-23 ENCOUNTER — Ambulatory Visit: Payer: 59 | Admitting: Urology

## 2021-11-23 ENCOUNTER — Encounter: Payer: Self-pay | Admitting: Urology

## 2021-11-23 VITALS — BP 155/88 | HR 118 | Ht 67.0 in | Wt 240.0 lb

## 2021-11-23 DIAGNOSIS — R3129 Other microscopic hematuria: Secondary | ICD-10-CM | POA: Diagnosis not present

## 2021-11-23 DIAGNOSIS — N3289 Other specified disorders of bladder: Secondary | ICD-10-CM | POA: Diagnosis not present

## 2021-11-23 DIAGNOSIS — R3915 Urgency of urination: Secondary | ICD-10-CM | POA: Diagnosis not present

## 2021-11-23 LAB — MICROSCOPIC EXAMINATION

## 2021-11-23 LAB — URINALYSIS, COMPLETE
Bilirubin, UA: NEGATIVE
Glucose, UA: NEGATIVE
Ketones, UA: NEGATIVE
Leukocytes,UA: NEGATIVE
Nitrite, UA: NEGATIVE
Specific Gravity, UA: 1.025 (ref 1.005–1.030)
Urobilinogen, Ur: 0.2 mg/dL (ref 0.2–1.0)
pH, UA: 5 (ref 5.0–7.5)

## 2021-11-23 MED ORDER — OXYBUTYNIN CHLORIDE 5 MG PO TABS: ORAL_TABLET | ORAL | 3 refills | Status: AC

## 2021-11-23 NOTE — Patient Instructions (Signed)
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
## 2021-12-20 ENCOUNTER — Other Ambulatory Visit: Payer: 59 | Admitting: Urology

## 2021-12-21 ENCOUNTER — Ambulatory Visit: Payer: 59

## 2022-09-10 ENCOUNTER — Other Ambulatory Visit: Payer: Self-pay

## 2022-09-10 DIAGNOSIS — Z1231 Encounter for screening mammogram for malignant neoplasm of breast: Secondary | ICD-10-CM

## 2022-09-16 IMAGING — US US ABDOMEN LIMITED
1 series · 14 of 25 positions shown · non-contrast
Comparison: CT Abdomen and Pelvis 3232 hours today.

CLINICAL DATA: 40-year-old female with right upper quadrant
abdominal pain for 1 day.

EXAM:
ULTRASOUND ABDOMEN LIMITED RIGHT UPPER QUADRANT

[Series 1: us abdomen limited ruq (liver/gb) · 14 of 43 slices shown]
[im 1/43]
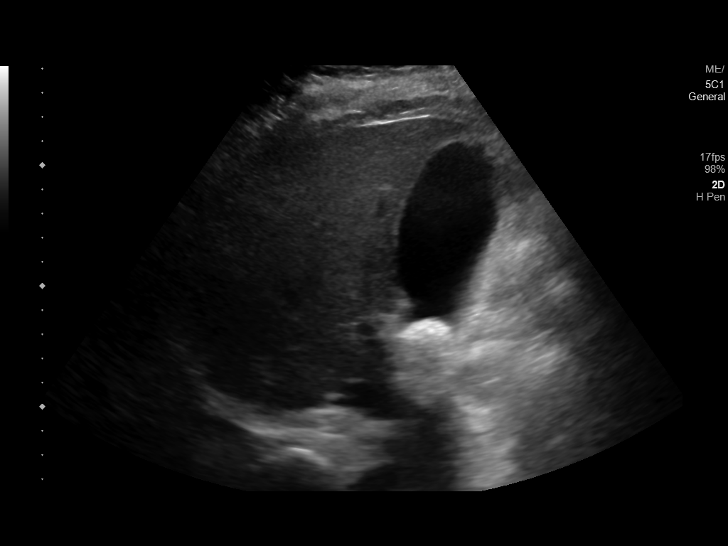
[im 4/43]
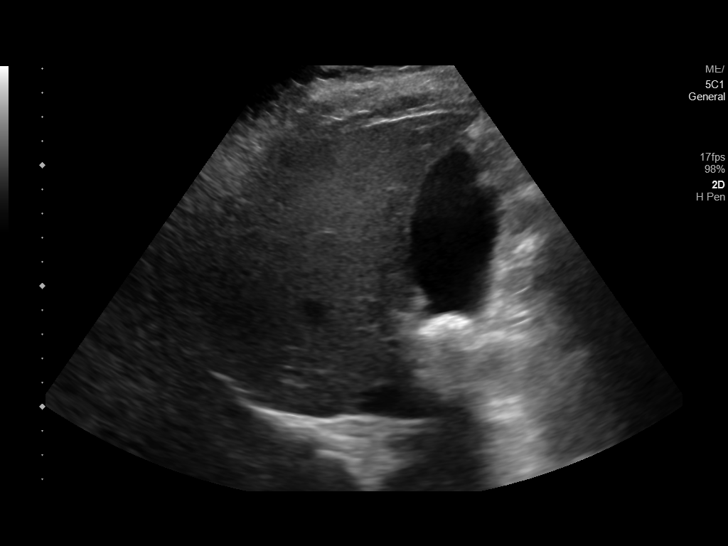
[im 8/43]
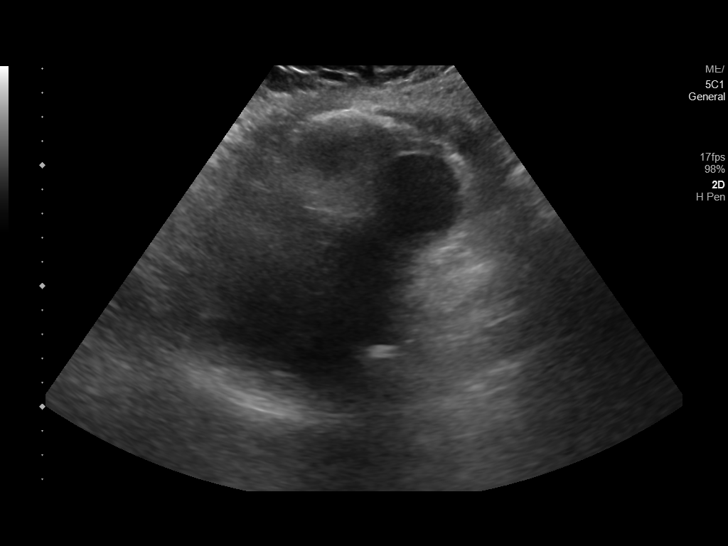
[im 11/43]
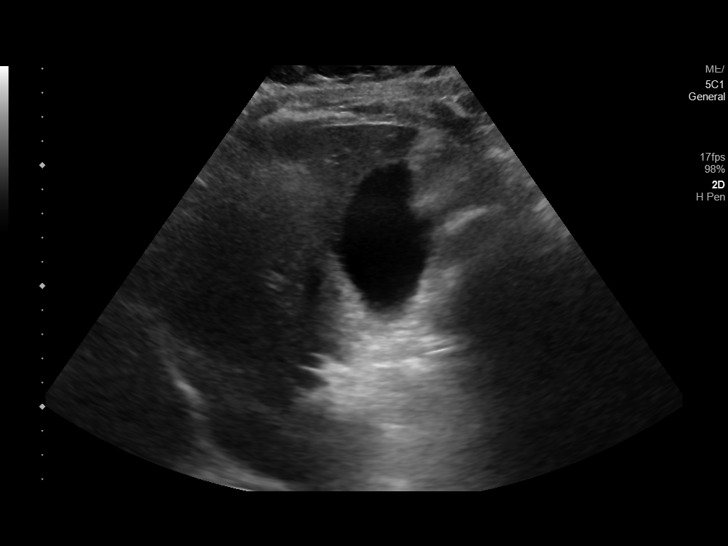
[im 15/43]
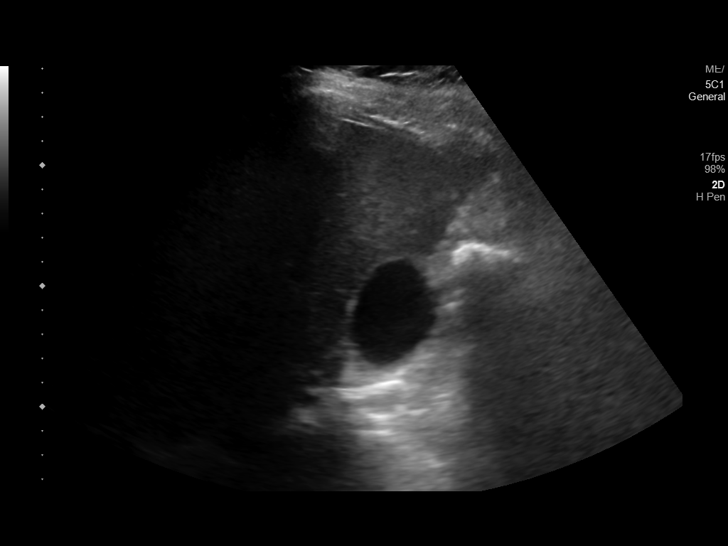
[im 16/43]
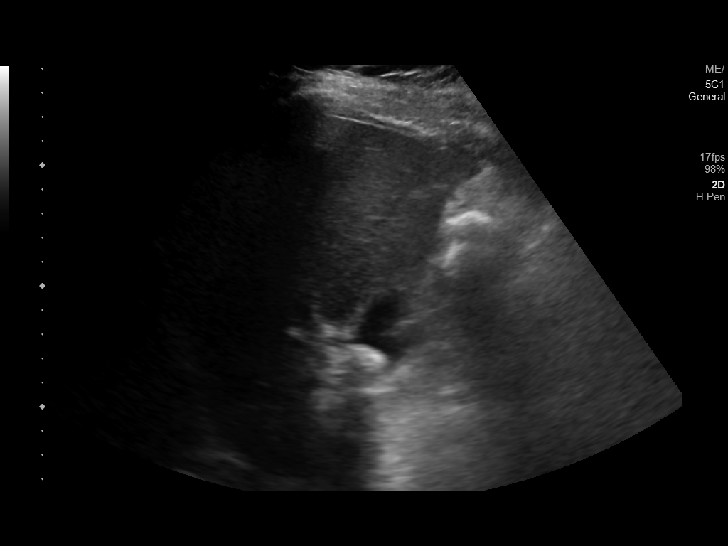
[im 20/43]
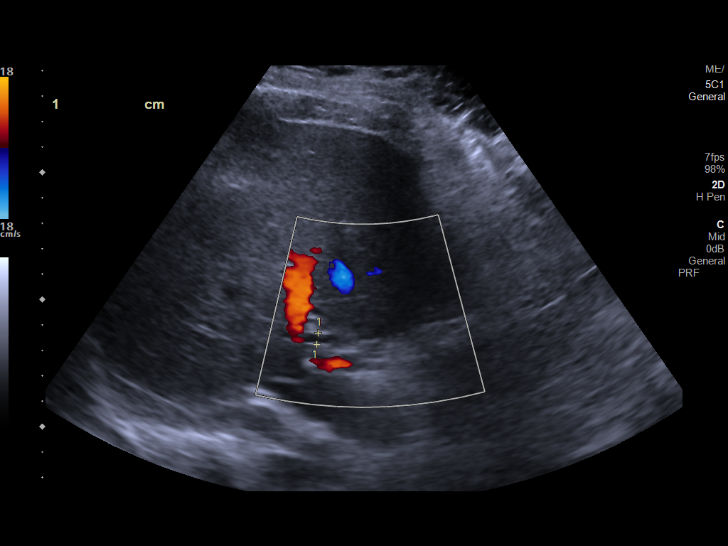
[im 23/43]
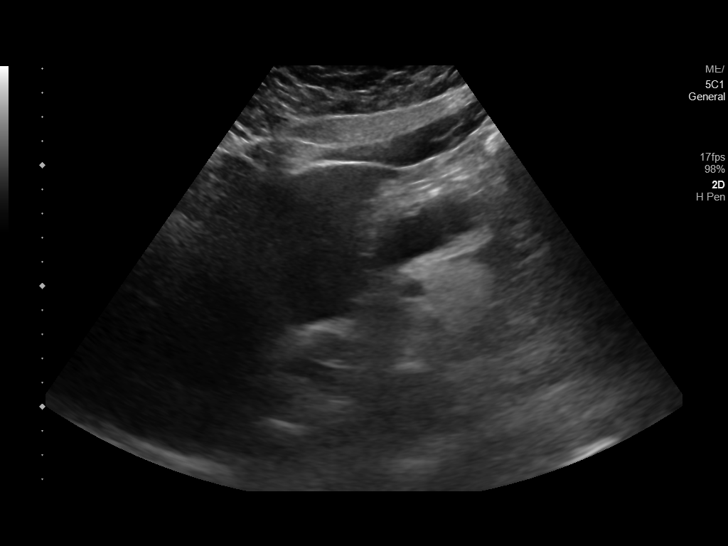
[im 27/43]
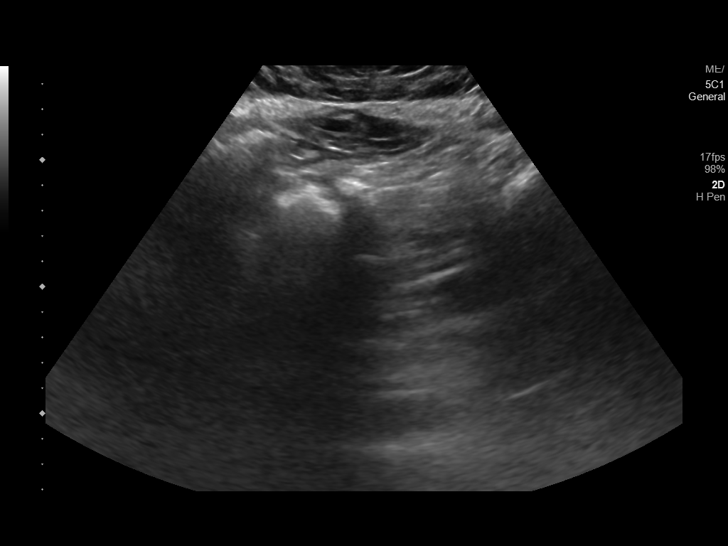
[im 29/43]
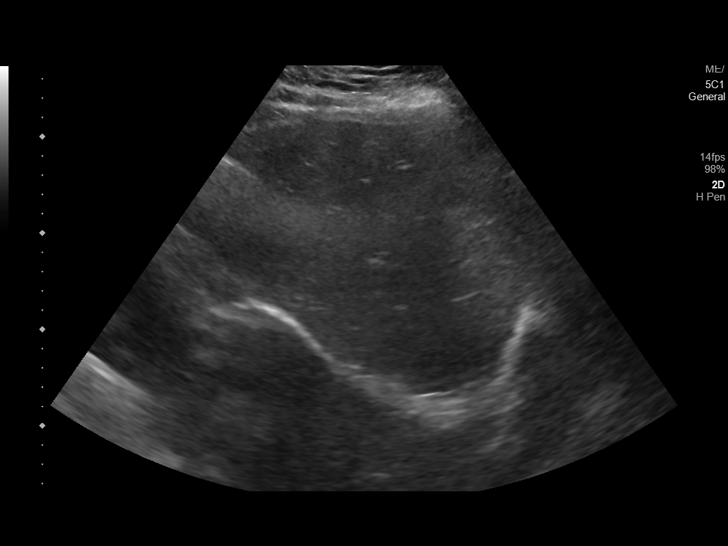
[im 32/43]
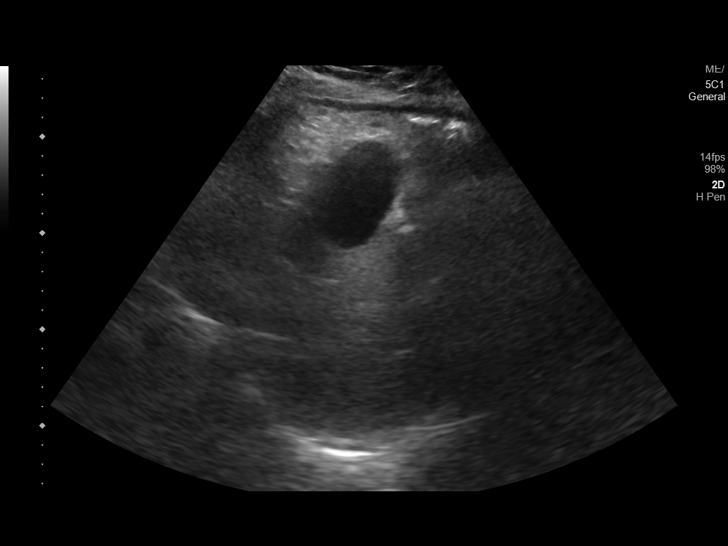
[im 36/43]
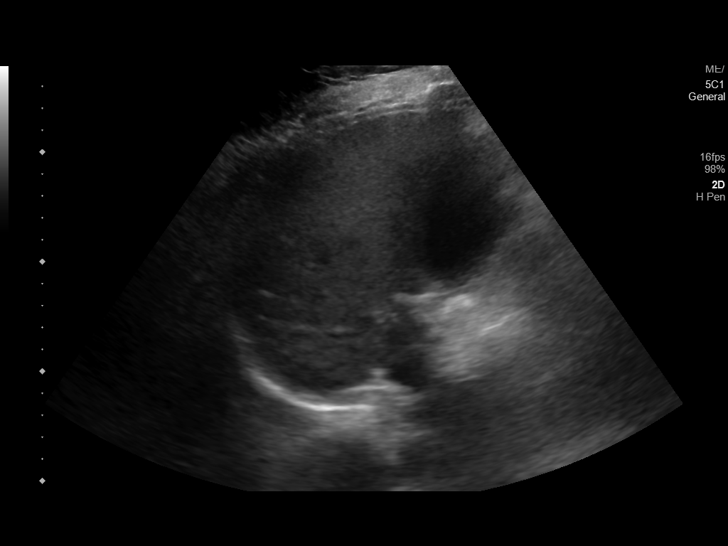
[im 39/43]
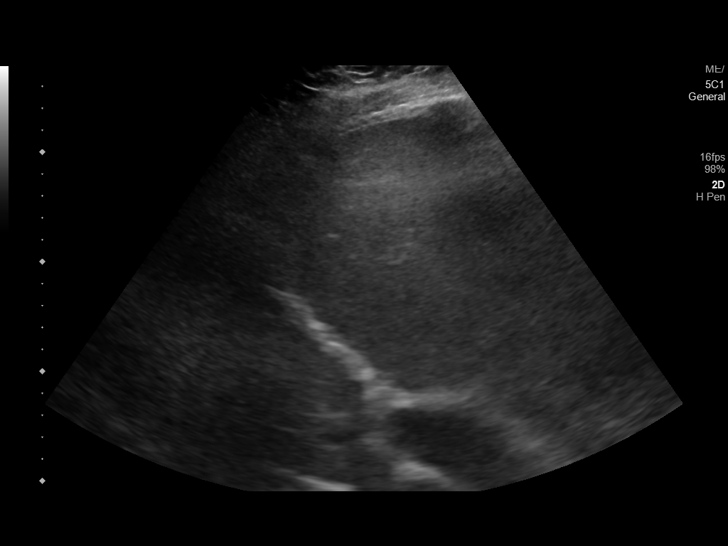
[im 43/43]
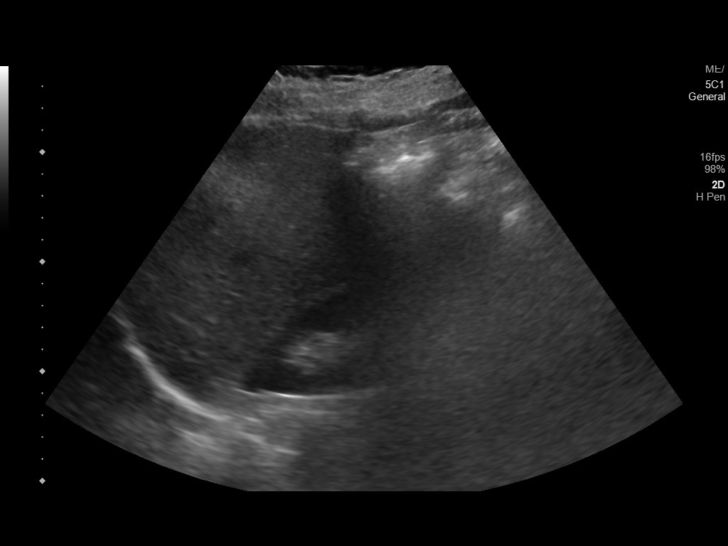

[14 of 25 positions shown; findings below may reference images not displayed]

FINDINGS: Gallbladder:

Shadowing echogenic stone in the gallbladder neck (image 6),
estimated at 23 mm diameter and nonmobile with changes in patient
positioning. Gallbladder wall thickness remains normal. No
pericholecystic fluid. No sonographic Murphy sign elicited.

Common bile duct:

Diameter: 4-5 mm, normal.

Liver:

No focal lesion identified. Within normal limits in parenchymal
echogenicity. Portal vein is patent on color Doppler imaging with
normal direction of blood flow towards the liver.

Other: Negative visible right kidney.
IMPRESSION: Relatively large 2.3 cm gallstone appears to be lodged in the neck
of the gallbladder.
But there is no wall thickening or sonographic Murphy's sign to
indicate acute cholecystitis.
No evidence of bile duct obstruction.

## 2023-03-14 ENCOUNTER — Other Ambulatory Visit: Payer: Self-pay

## 2023-03-14 ENCOUNTER — Emergency Department
Admission: EM | Admit: 2023-03-14 | Discharge: 2023-03-14 | Disposition: A | Discharge status: Home / Self Care | Payer: No Typology Code available for payment source | Attending: Emergency Medicine | Admitting: Emergency Medicine

## 2023-03-14 DIAGNOSIS — R519 Headache, unspecified: Secondary | ICD-10-CM | POA: Diagnosis present

## 2023-03-14 DIAGNOSIS — G519 Disorder of facial nerve, unspecified: Secondary | ICD-10-CM | POA: Diagnosis not present

## 2023-03-14 MED ORDER — PREDNISONE 10 MG (21) PO TBPK: ORAL_TABLET | ORAL | 0 refills | Status: AC

## 2023-03-14 MED ORDER — OXYCODONE-ACETAMINOPHEN 5-325 MG PO TABS
1.0000 | ORAL_TABLET | Freq: Once | ORAL | Status: AC
  Administered 2023-03-14: 1 via ORAL
  Filled 2023-03-14: qty 1

## 2023-03-14 MED ORDER — DEXAMETHASONE SODIUM PHOSPHATE 10 MG/ML IJ SOLN
10.0000 mg | Freq: Once | INTRAMUSCULAR | Status: AC
  Administered 2023-03-14: 10 mg via INTRAMUSCULAR
  Filled 2023-03-14: qty 1

## 2023-03-14 NOTE — ED Notes (Signed)
D/C and new RX discussed with pt, pt verbalized understanding. NAD noted. Pt ambulatory with steady gait on D/C.  

## 2023-03-14 NOTE — ED Triage Notes (Signed)
 Pt comes with c/I right sided jaw and face pain. 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 Pt states it is more than that this time. Pt states this started last night around 10pm and has not let up.

## 2023-03-14 NOTE — Discharge Instructions (Signed)
 Please follow-up with neurology when possible.  Keep your upcoming appointment for the root canal.  Return to the emergency department immediately if you notice any facial weakness, facial drooping, change in sensation, or other new concerns.

## 2023-03-14 NOTE — ED Provider Notes (Signed)
 [facility] Provider Note    Event Date/Time   First MD Initiated Contact with Patient 03/14/23 [phone]     (approximate)   History   Facial Pain   HPI  Haley Hunt is a 43 y.o. female with history of TMJ and as listed in EMR presents to the emergency department for treatment and evaluation of right side jaw and face pain.  Pain started last night around 10 PM. No relief with her chronic pain medications. She is scheduled for a root canal next week for a tooth on the right lower jaw, but wonders if the dental pain is actually coming from the pain from the nerve associated with TMJ.       Physical Exam   Triage Vital Signs: ED Triage Vitals  Encounter Vitals Group     BP 03/14/23 0719 (!) 146/98     Systolic BP Percentile --      Diastolic BP Percentile --      Pulse Rate 03/14/23 0719 86     Resp 03/14/23 0719 17     Temp 03/14/23 0719 98.4 F (36.9 C)     Temp src --      SpO2 03/14/23 0719 98 %     Weight 03/14/23 0718 228 lb (103.4 kg)     Height 03/14/23 0718 5' 7 (1.702 m)     Head Circumference --      Peak Flow --      Pain Score 03/14/23 0718 10     Pain Loc --      Pain Education --      Exclude from Growth Chart --     Most recent vital signs: Vitals:   03/14/23 0719  BP: (!) 146/98  Pulse: 86  Resp: 17  Temp: 98.4 F (36.9 C)  SpO2: 98%    General: Awake, no distress.  CV:  Good peripheral perfusion.  Resp:  Normal effort.  Abd:  No distention.  Other:  Tooth 31 with silver filling. No gingival swelling or lesions.   No facial nerve palsy. No altered sensation in right face. No extremity weakness   ED Results / Procedures / Treatments   Labs (all labs ordered are listed, but only abnormal results are displayed) Labs Reviewed - No data to display   EKG  Not indicated.   RADIOLOGY  Image and radiology report reviewed and interpreted by me. Radiology report consistent with the same.  Not  indicated.  PROCEDURES:  Critical Care performed: No  Procedures   MEDICATIONS ORDERED IN ED:  Medications  dexamethasone  (DECADRON ) injection 10 mg (10 mg Intramuscular Given 03/14/23 0757)  oxyCODONE -acetaminophen  (PERCOCET/ROXICET) 5-325 MG per tablet 1 tablet (1 tablet Oral Given 03/14/23 0756)     IMPRESSION / MDM / ASSESSMENT AND PLAN / ED COURSE   I have reviewed the triage note.  Differential diagnosis includes, but is not limited to, dental pain/neuropathy, trigeminal neuralgia, herpes zoster.  Patient's presentation is most consistent with acute illness / injury with system symptoms.  43 year old female presenting to the emergency department due to acute on chronic facial pain secondary to TMJ or dental related nerve pain.  She is scheduled for a root canal this upcoming week.  Exam is reassuring.  She has no decrease in sensation to the face and has no weakness or facial droop.  She has no indication of herpes zoster and denies having had any recent viral infections.  In the absence of the above symptoms,  imaging is not indicated at this time however the patient was given strict ER return precautions to return if she develops any new concerning symptoms.  She was also encouraged to keep her scheduled dental appointment and call her neurologist.  She was encouraged to continue her pain management plan as prescribed and without taking extra doses.  Patient agreeable to the plan.  While here she was given IM Decadron  and will be given a prescription for prednisone  to be taken if pain does not resolve or if pain resolves and then returns.      FINAL CLINICAL IMPRESSION(S) / ED DIAGNOSES   Final diagnoses:  Facial neuropathy     Rx / DC Orders   ED Discharge Orders          Ordered    predniSONE  (STERAPRED UNI-PAK 21 TAB) 10 MG (21) TBPK tablet        03/14/23 0805             Note:  This document was prepared using Dragon voice recognition software and may  include unintentional dictation errors.   Herlinda Kirk NOVAK, FNP 03/14/23 9175    Bradler, Evan K, MD 03/14/23 [phone]

## 2023-04-21 ENCOUNTER — Ambulatory Visit
Admission: EM | Admit: 2023-04-21 | Discharge: 2023-04-21 | Disposition: A | Discharge status: Home / Self Care | Payer: No Typology Code available for payment source

## 2023-04-21 DIAGNOSIS — S161XXA Strain of muscle, fascia and tendon at neck level, initial encounter: Secondary | ICD-10-CM | POA: Diagnosis not present

## 2023-04-21 DIAGNOSIS — M7918 Myalgia, other site: Secondary | ICD-10-CM
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
## 2023-04-21 NOTE — Discharge Instructions (Signed)
 NECK PAIN: Stressed avoiding painful activities. This can exacerbate your symptoms and make them worse.  May apply heat to the areas of pain for some relief. Use medications as directed. Continue all home meds. Be aware of which medications make you drowsy and do not drive or operate any kind of heavy machinery while using the medication (ie pain medications or muscle relaxers). F/U with PCP or ortho if worsening symptoms.   NECK PAIN RED FLAGS: If symptoms get worse than they are right now, you should come back sooner for re-evaluation. If you have increased numbness/ tingling or notice that the numbness/tingling is affecting the legs or saddle region, go to ER. If you ever lose continence go to ER.
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
## 2023-05-16 ENCOUNTER — Ambulatory Visit
Admission: RE | Admit: 2023-05-16 | Discharge: 2023-05-16 | Disposition: A | Discharge status: Home / Self Care | Payer: No Typology Code available for payment source | Source: Ambulatory Visit | Attending: Obstetrics and Gynecology | Admitting: Obstetrics and Gynecology

## 2023-05-16 DIAGNOSIS — Z1231 Encounter for screening mammogram for malignant neoplasm of breast: Secondary | ICD-10-CM | POA: Diagnosis present

## 2023-05-22 ENCOUNTER — Other Ambulatory Visit: Payer: Self-pay | Admitting: Obstetrics and Gynecology

## 2023-05-22 DIAGNOSIS — R928 Other abnormal and inconclusive findings on diagnostic imaging of breast: Secondary | ICD-10-CM

## 2023-05-29 ENCOUNTER — Ambulatory Visit
Admission: RE | Admit: 2023-05-29 | Discharge: 2023-05-29 | Disposition: A | Discharge status: Home / Self Care | Source: Ambulatory Visit | Attending: Obstetrics and Gynecology | Admitting: Obstetrics and Gynecology

## 2023-05-29 DIAGNOSIS — R928 Other abnormal and inconclusive findings on diagnostic imaging of breast: Secondary | ICD-10-CM

## 2023-11-17 ENCOUNTER — Emergency Department

## 2023-11-17 ENCOUNTER — Emergency Department
Admission: EM | Admit: 2023-11-17 | Discharge: 2023-11-17 | Disposition: A | Discharge status: Home / Self Care | Attending: Emergency Medicine | Admitting: Emergency Medicine

## 2023-11-17 ENCOUNTER — Other Ambulatory Visit: Payer: Self-pay

## 2023-11-17 DIAGNOSIS — N202 Calculus of kidney with calculus of ureter: Secondary | ICD-10-CM

## 2023-11-17 DIAGNOSIS — R109 Unspecified abdominal pain: Secondary | ICD-10-CM

## 2023-11-17 DIAGNOSIS — N132 Hydronephrosis with renal and ureteral calculous obstruction: Secondary | ICD-10-CM | POA: Diagnosis not present

## 2023-11-17 DIAGNOSIS — R1031 Right lower quadrant pain: Secondary | ICD-10-CM | POA: Diagnosis present

## 2023-11-17 DIAGNOSIS — D72829 Elevated white blood cell count, unspecified: Secondary | ICD-10-CM | POA: Diagnosis not present

## 2023-11-17 LAB — URINALYSIS, ROUTINE W REFLEX MICROSCOPIC
Bilirubin Urine: NEGATIVE
Glucose, UA: NEGATIVE mg/dL
Ketones, ur: NEGATIVE mg/dL
Leukocytes,Ua: NEGATIVE
Nitrite: NEGATIVE
Protein, ur: NEGATIVE mg/dL
RBC / HPF: >50 RBC/hpf (ref 0–5)
Specific Gravity, Urine: 1.018 (ref 1.005–1.030)
pH: 6 (ref 5.0–8.0)

## 2023-11-17 LAB — COMPREHENSIVE METABOLIC PANEL WITH GFR
ALT: 12 U/L (ref 0–44)
AST: 19 U/L (ref 15–41)
Albumin: 4.4 g/dL (ref 3.5–5.0)
Alkaline Phosphatase: 83 U/L (ref 38–126)
Anion gap: 8 (ref 5–15)
BUN: 7 mg/dL (ref 6–20)
CO2: 20 mmol/L — ABNORMAL LOW (ref 22–32)
Calcium: 8.6 mg/dL — ABNORMAL LOW (ref 8.9–10.3)
Chloride: 102 mmol/L (ref 98–111)
Creatinine, Ser: 0.82 mg/dL (ref 0.44–1.00)
GFR, Estimated: >60 mL/min (ref >60)
Glucose, Bld: 102 mg/dL — ABNORMAL HIGH (ref 70–99)
Potassium: 3.6 mmol/L (ref 3.5–5.1)
Sodium: 130 mmol/L — ABNORMAL LOW (ref 135–145)
Total Bilirubin: 0.6 mg/dL (ref 0.0–1.2)
Total Protein: 7.5 g/dL (ref 6.5–8.1)

## 2023-11-17 LAB — CBC
HCT: 41 % (ref 36.0–46.0)
Hemoglobin: 14.3 g/dL (ref 12.0–15.0)
MCH: 29.6 pg (ref 26.0–34.0)
MCHC: 34.9 g/dL (ref 30.0–36.0)
MCV: 84.9 fL (ref 80.0–100.0)
Platelets: 267 K/uL (ref 150–400)
RBC: 4.83 MIL/uL (ref 3.87–5.11)
RDW: 12.1 % (ref 11.5–15.5)
WBC: 11.2 K/uL — ABNORMAL HIGH (ref 4.0–10.5)
nRBC: 0 % (ref 0.0–0.2)

## 2023-11-17 LAB — POC URINE PREG, ED: Preg Test, Ur: NEGATIVE

## 2023-11-17 LAB — LIPASE, BLOOD: Lipase: 35 U/L (ref 11–51)

## 2023-11-17 MED ORDER — IBUPROFEN 200 MG PO TABS
600.0000 mg | ORAL_TABLET | Freq: Three times a day (TID) | ORAL | 2 refills | Status: AC | PRN
Start: 2023-11-17 — End: 2024-11-16

## 2023-11-17 MED ORDER — ONDANSETRON HCL 4 MG/2ML IJ SOLN
4.0000 mg | Freq: Once | INTRAMUSCULAR | Status: AC
  Administered 2023-11-17: 4 mg via INTRAVENOUS
  Filled 2023-11-17: qty 2

## 2023-11-17 MED ORDER — ONDANSETRON 4 MG PO TBDP: 4.0000 mg | ORAL_TABLET | Freq: Three times a day (TID) | ORAL | 0 refills | Status: AC | PRN

## 2023-11-17 MED ORDER — KETOROLAC TROMETHAMINE 30 MG/ML IJ SOLN
30.0000 mg | Freq: Once | INTRAMUSCULAR | Status: AC
  Administered 2023-11-17: 30 mg via INTRAVENOUS
  Filled 2023-11-17: qty 1

## 2023-11-17 MED ORDER — ACETAMINOPHEN 500 MG PO TABS
1000.0000 mg | ORAL_TABLET | Freq: Four times a day (QID) | ORAL | 2 refills | Status: AC | PRN
Start: 2023-11-17 — End: 2024-11-16

## 2023-11-17 MED ORDER — SODIUM CHLORIDE 0.9 % IV BOLUS
1000.0000 mL | Freq: Once | INTRAVENOUS | Status: AC
  Administered 2023-11-17: 1000 mL via INTRAVENOUS

## 2023-11-17 MED ORDER — TAMSULOSIN HCL 0.4 MG PO CAPS: 0.4000 mg | ORAL_CAPSULE | Freq: Every day | ORAL | 0 refills | Status: AC

## 2023-11-17 NOTE — Discharge Instructions (Signed)
 Your evaluation in the emergency department was notable for a kidney stone, and I do believe this is the cause of your pain.  Fortunately we saw no complications of this today.  Please do follow-up with your urologist in clinic for reevaluation.  I prescribed several pain medications as well as a nausea medication (ondansetron ) and a pill to help pass the stone (Flomax ).  Please also follow-up with your primary care provider in addition.  Return to the emergency department with any new or worsening symptoms including uncontrollable pain, inability to urinate, fevers, or any other symptoms concerning to you.  We provided you a urine strainer to use while urinating over the next several days.  If you do catch your stone, you can place this in a plastic baggy and bring it to your urology appointment.

## 2023-11-17 NOTE — ED Provider Notes (Signed)
  Physical Exam  BP (!) 149/122   Pulse 62   Temp 98.7 F (37.1 C) (Oral)   Resp 18   SpO2 98%   Physical Exam  Procedures  Procedures  ED Course / MDM   Clinical Course as of 11/17/23 1632  Mon Nov 17, 2023  1459 S/o from Dr. Jossie: 80F R flank pain - labs reassuring, mild leukocytosis - Suspect urolithiasis -No evidence of UTI  TO DO: - f/u CT [MM]  1537 CTAP: IMPRESSION: 1. 5 mm obstructing renal calculus at the right UVJ. 2. 2 mm nonobstructing right renal calculus. 3. Evidence of prior cholecystectomy.   [MM]  1627 Patient reevaluated, pain adequately controlled.  Discussed urolithiasis, does have history of prior.  She is already established with a urologist, will follow-up with them outpatient.  Is already on chronic pain management with opiates, will prescribe Tylenol  and Motrin  as well as Flomax  and Zofran .  Will provide urine strainer.  No evidence of underlying UTI.  Patient agrees with plan.  ED return precautions placed. [MM]    Clinical Course User Index [MM] Clarine Ozell LABOR, MD   Medical Decision Making Amount and/or Complexity of Data Reviewed Labs: ordered. Radiology: ordered.  Risk OTC drugs. Prescription drug management.          Clarine Ozell LABOR, MD 11/17/23 [phone]

## 2023-11-17 NOTE — ED Triage Notes (Signed)
 Pt to ED via POV from home. Pt reports right flank pain on Friday that went away and came back this morning. Pt with hx of kidney stones and ovarian cyst. Pt reports increased urination. Gallbladder removed.

## 2023-11-17 NOTE — ED Provider Notes (Signed)
 [facility] Provider Note   Event Date/Time   First MD Initiated Contact with Patient 11/17/23 1223     (approximate) History  Flank Pain  HPI Haley Hunt is a 43 y.o. female with a stated past medical history of kidney stones who presents complaining of right flank pain that began approximately 2 hours prior to arrival.  Patient describes a 10/10 pain in the right flank that radiates into the right aspect of the groin with associated nausea and vomiting.  Patient states that this is similar to previous kidney stones that she has had in the past however this pain is worse ROS: Patient currently denies any vision changes, tinnitus, difficulty speaking, facial droop, sore throat, chest pain, shortness of breath, diarrhea, dysuria, or weakness/numbness/paresthesias in any extremity   Physical Exam  Triage Vital Signs: ED Triage Vitals [11/17/23 1151]  Encounter Vitals Group     BP (!) 149/122     Girls Systolic BP Percentile      Girls Diastolic BP Percentile      Boys Systolic BP Percentile      Boys Diastolic BP Percentile      Pulse Rate 62     Resp 18     Temp 98.7 F (37.1 C)     Temp Source Oral     SpO2 98 %     Weight      Height      Head Circumference      Peak Flow      Pain Score 8     Pain Loc      Pain Education      Exclude from Growth Chart    Most recent vital signs: Vitals:   11/17/23 1151  BP: (!) 149/122  Pulse: 62  Resp: 18  Temp: 98.7 F (37.1 C)  SpO2: 98%   General: Awake, oriented x4. CV:  Good peripheral perfusion. Resp:  Normal effort. Abd:  No distention.  Right CVA tenderness to percussion Other:  Middle-aged overweight Caucasian female resting uncomfortably in chair in room in mild distress secondary to pain ED Results / Procedures / Treatments  Labs (all labs ordered are listed, but only abnormal results are displayed) Labs Reviewed  COMPREHENSIVE METABOLIC PANEL WITH GFR - Abnormal; Notable for the  following components:      Result Value   Sodium 130 (*)    CO2 20 (*)    Glucose, Bld 102 (*)    Calcium 8.6 (*)    All other components within normal limits  CBC - Abnormal; Notable for the following components:   WBC 11.2 (*)    All other components within normal limits  LIPASE, BLOOD  URINALYSIS, ROUTINE W REFLEX MICROSCOPIC  POC URINE PREG, ED  RADIOLOGY ED MD interpretation: Pending - All radiology independently interpreted and agree with radiology assessment Official radiology report(s): No results found. PROCEDURES: Critical Care performed: No Procedures MEDICATIONS ORDERED IN ED: Medications - No data to display IMPRESSION / MDM / ASSESSMENT AND PLAN / ED COURSE  I reviewed the triage vital signs and the nursing notes.                             The patient is on the cardiac monitor to evaluate for evidence of arrhythmia and/or significant heart rate changes. Patient's presentation is most consistent with acute presentation with potential threat to life or bodily function. Patient is a 43 year old female  with the above-stated past medical history presents complaining of right flank pain in the setting of of previous history of kidney stones and states that this feels similar.  DDx: Kidney stone, urinary tract infection, biliary disease, appendicitis Plan: CBC showed leukocytosis to 11.2 CMP showing sodium 130, CO2 20, glucose 102, calcium 8.6 Lipase 35 POC pregnancy negative UA positive for moderate hemoglobin and few bacteria CT renal stone pending  Care of this patient will be signed out to the oncoming physician at the end of my shift.  All pertinent patient information conveyed and all questions answered.  All further care and disposition decisions will be made by the oncoming physician. Clinical Course as of 11/18/23 0705  Mon Nov 17, 2023  1459 S/o from Dr. Jossie: 32F R flank pain - labs reassuring, mild leukocytosis - Suspect urolithiasis -No evidence  of UTI  TO DO: - f/u CT [MM]  1537 CTAP: IMPRESSION: 1. 5 mm obstructing renal calculus at the right UVJ. 2. 2 mm nonobstructing right renal calculus. 3. Evidence of prior cholecystectomy.   [MM]  1627 Patient reevaluated, pain adequately controlled.  Discussed urolithiasis, does have history of prior.  She is already established with a urologist, will follow-up with them outpatient.  Is already on chronic pain management with opiates, will prescribe Tylenol  and Motrin  as well as Flomax  and Zofran .  Will provide urine strainer.  No evidence of underlying UTI.  Patient agrees with plan.  ED return precautions placed. [MM]    Clinical Course User Index [MM] Clarine Ozell LABOR, MD   FINAL CLINICAL IMPRESSION(S) / ED DIAGNOSES   Final diagnoses:  None   Rx / DC Orders   ED Discharge Orders     None      Note:  This document was prepared using Dragon voice recognition software and may include unintentional dictation errors.   Jossie Artist POUR, MD 11/18/23 [phone]
